# Patient Record
Sex: Female | Born: 1960 | Race: White | Hispanic: Yes | Marital: Single | Smoking: Current every day smoker
Health system: Southern US, Community
[De-identification: ages and names within clinical notes are randomized; demographics above are authoritative.]

## PROBLEM LIST (undated history)

## (undated) DIAGNOSIS — I10 Essential (primary) hypertension: Secondary | ICD-10-CM

## (undated) DIAGNOSIS — T300 Burn of unspecified body region, unspecified degree: Secondary | ICD-10-CM

## (undated) HISTORY — PX: SKIN GRAFT: SHX250

---

## 2007-03-21 ENCOUNTER — Emergency Department (HOSPITAL_COMMUNITY): Admission: EM | Admit: 2007-03-21 | Discharge: 2007-03-21 | Payer: Self-pay | Admitting: Emergency Medicine

## 2009-02-05 IMAGING — CR DG CHEST 2V
2 series · 2 of 2 positions shown · non-contrast
Comparison: None.

CLINICAL DATA: Soft tissue mass posterior left chest/cough.
 CHEST - 2 VIEW:

[w chest pa]
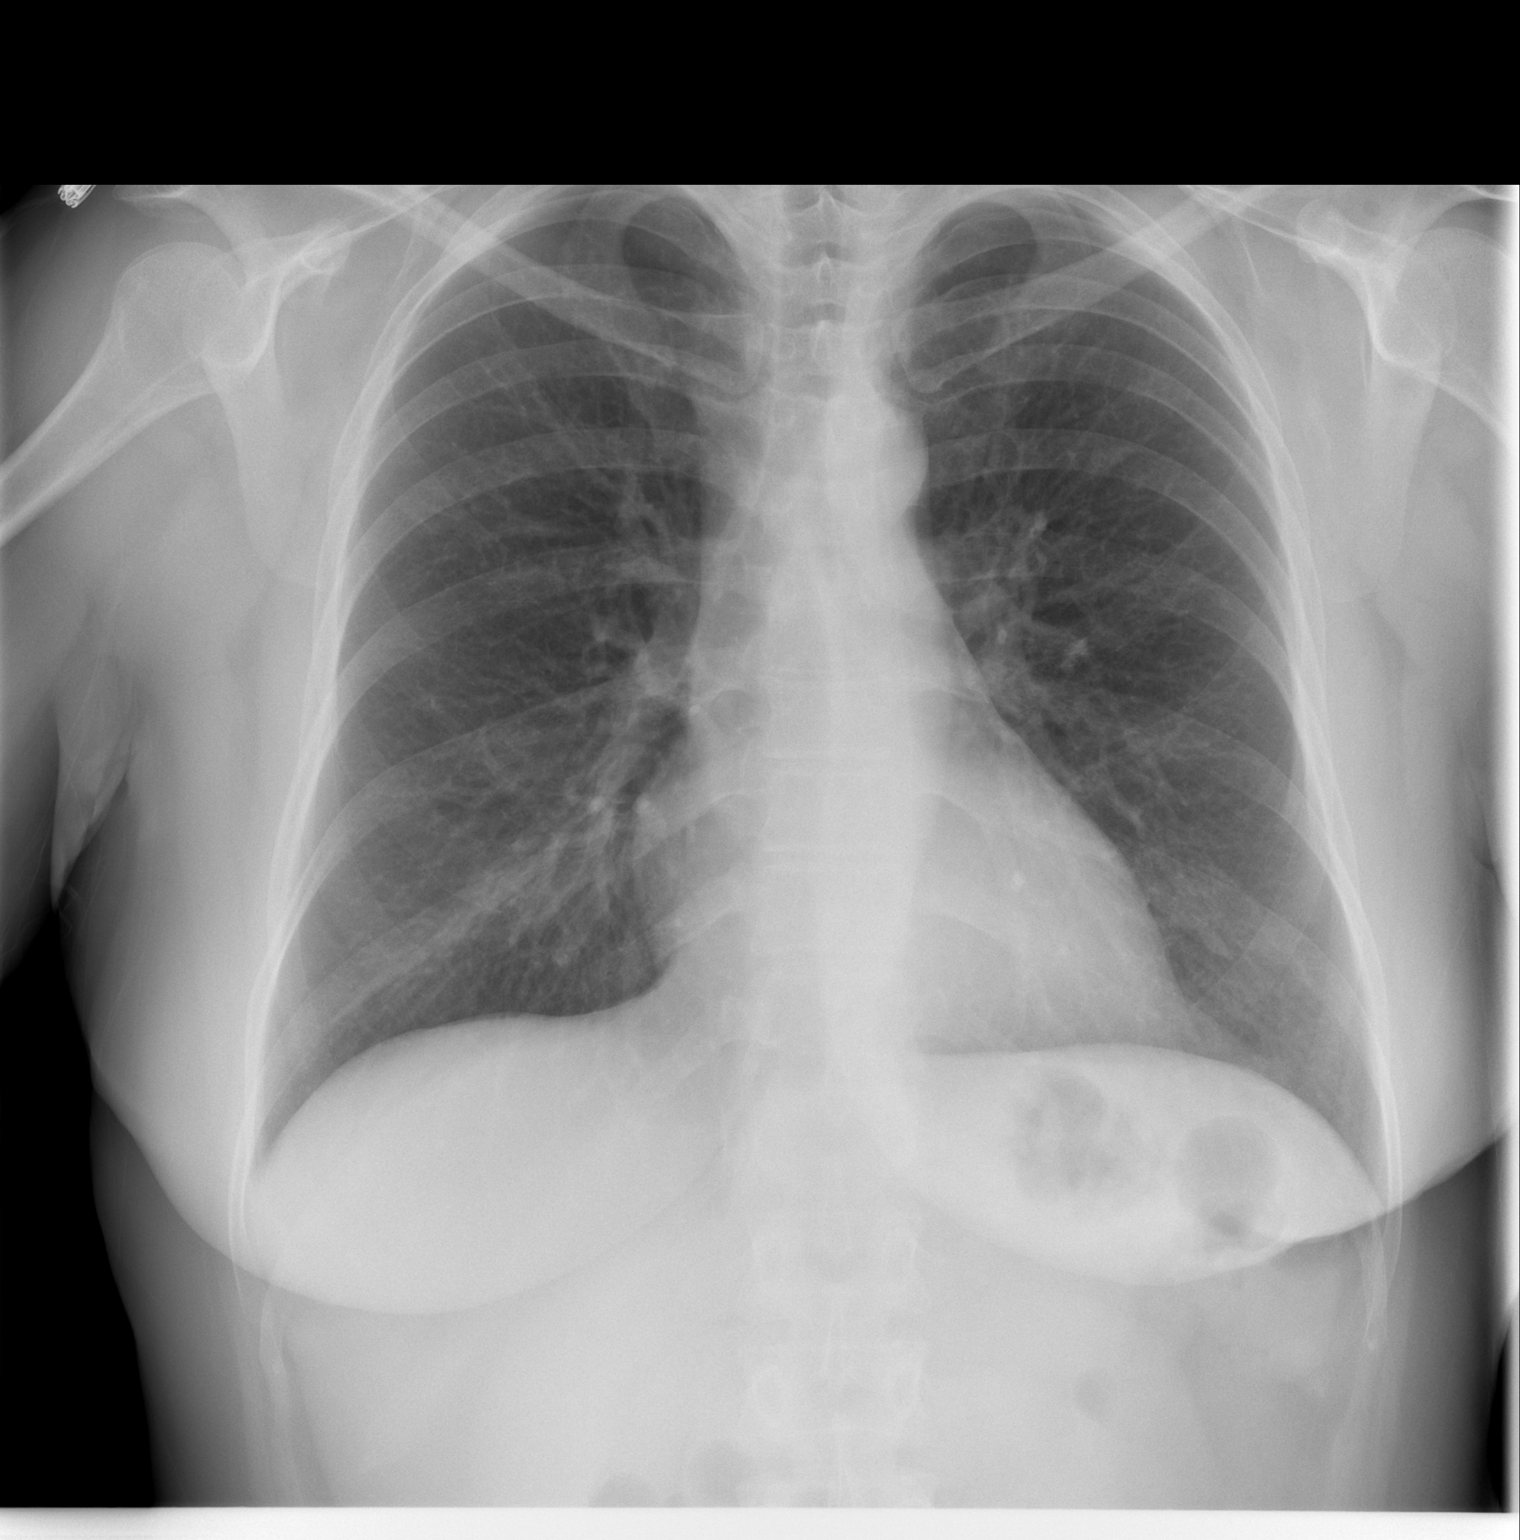

[w chest lat]
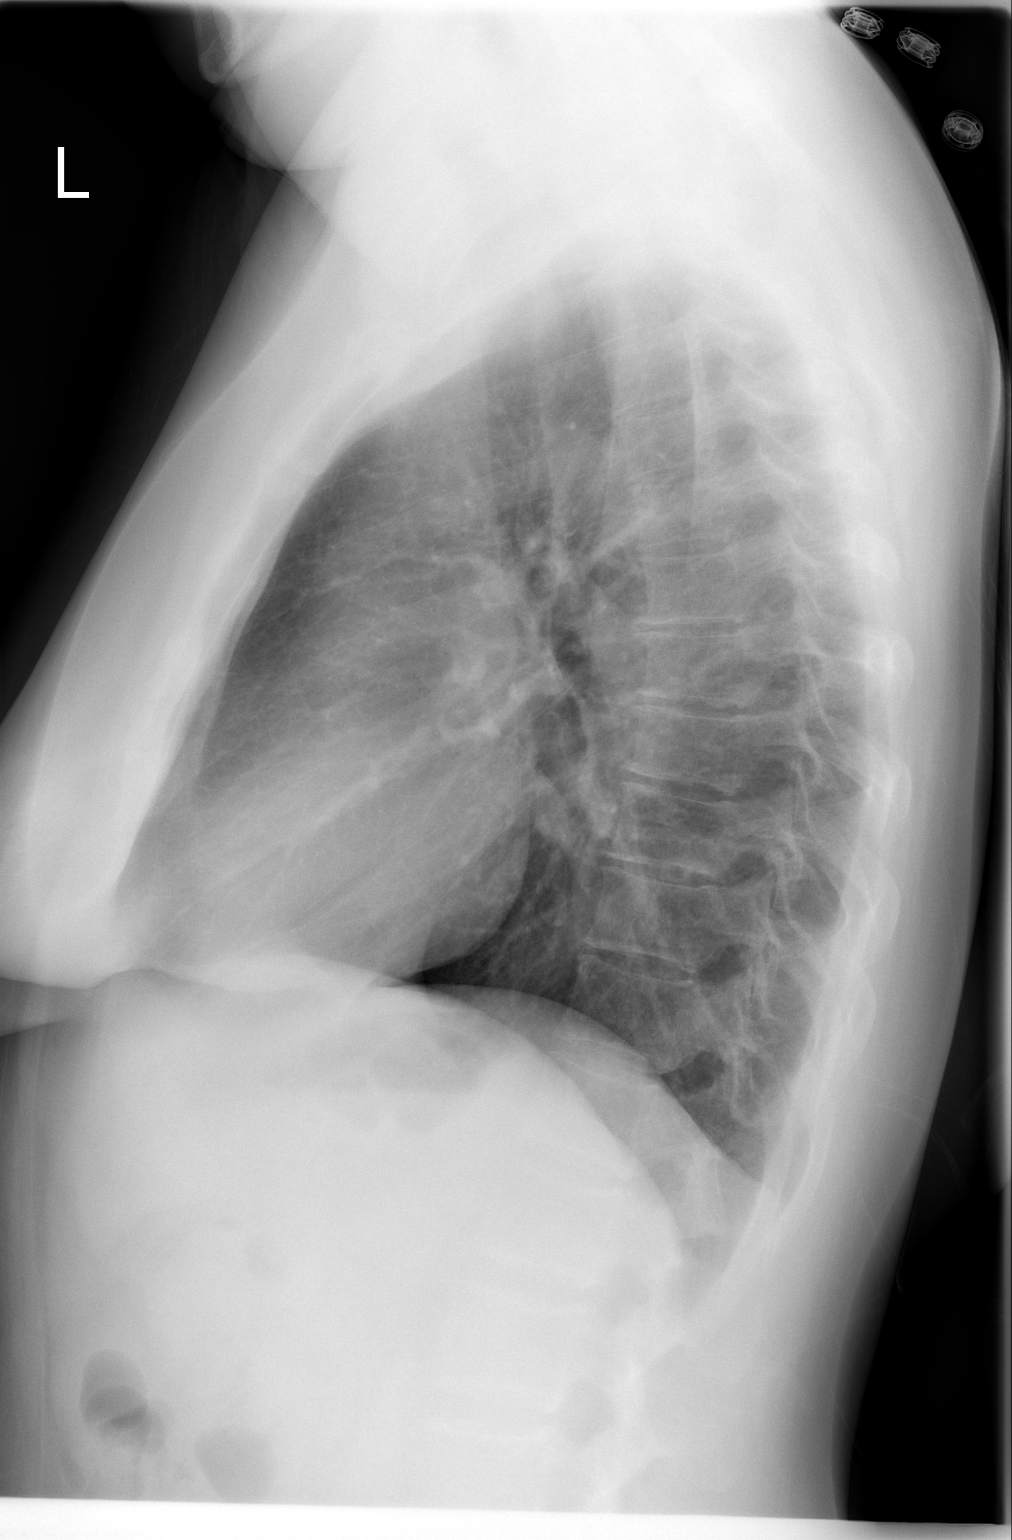

[2 of 2 positions shown; findings below may reference images not displayed]

FINDINGS: The heart and lungs are normal. There is mild peribronchial thickening. No soft tissue or bony mass is evident.
IMPRESSION: 1.  No active cardiopulmonary disease. 
 2.  No soft tissue or bony chest wall masses.

## 2014-05-12 ENCOUNTER — Emergency Department (HOSPITAL_COMMUNITY): Payer: Medicaid - Out of State

## 2014-05-12 ENCOUNTER — Encounter (HOSPITAL_COMMUNITY): Payer: Self-pay | Admitting: Emergency Medicine

## 2014-05-12 DIAGNOSIS — R079 Chest pain, unspecified: Secondary | ICD-10-CM | POA: Diagnosis not present

## 2014-05-12 DIAGNOSIS — I10 Essential (primary) hypertension: Secondary | ICD-10-CM | POA: Diagnosis not present

## 2014-05-12 DIAGNOSIS — Z720 Tobacco use: Secondary | ICD-10-CM | POA: Diagnosis not present

## 2014-05-12 LAB — CBC
HEMATOCRIT: 43.7 % (ref 36.0–46.0)
HEMOGLOBIN: 15.1 g/dL — AB (ref 12.0–15.0)
MCH: 29.5 pg (ref 26.0–34.0)
MCHC: 34.6 g/dL (ref 30.0–36.0)
MCV: 85.5 fL (ref 78.0–100.0)
Platelets: 306 10*3/uL (ref 150–400)
RBC: 5.11 MIL/uL (ref 3.87–5.11)
RDW: 13.3 % (ref 11.5–15.5)
WBC: 11.3 10*3/uL — ABNORMAL HIGH (ref 4.0–10.5)

## 2014-05-12 LAB — BASIC METABOLIC PANEL
Anion gap: 16 — ABNORMAL HIGH (ref 5–15)
BUN: 13 mg/dL (ref 6–23)
CALCIUM: 9.6 mg/dL (ref 8.4–10.5)
CO2: 21 mEq/L (ref 19–32)
Chloride: 105 mEq/L (ref 96–112)
Creatinine, Ser: 0.59 mg/dL (ref 0.50–1.10)
GFR calc Af Amer: >90 mL/min (ref >90)
GLUCOSE: 122 mg/dL — AB (ref 70–99)
Potassium: 3.6 mEq/L — ABNORMAL LOW (ref 3.7–5.3)
Sodium: 142 mEq/L (ref 137–147)

## 2014-05-12 LAB — I-STAT TROPONIN, ED: Troponin i, poc: 0.03 ng/mL (ref 0.00–0.08)

## 2014-05-12 LAB — PRO B NATRIURETIC PEPTIDE: Pro B Natriuretic peptide (BNP): 22.7 pg/mL (ref 0–125)

## 2014-05-12 NOTE — ED Notes (Signed)
Pt.; reports left lateral ribcage pain worse with deep inspiration onset today , denies injury or fall , mild SOB with occasional productive cough . No  nausea or diaphoresis .

## 2014-05-13 ENCOUNTER — Emergency Department (HOSPITAL_COMMUNITY)
Admission: EM | Admit: 2014-05-13 | Discharge: 2014-05-13 | Discharge status: Against Medical Advice | Payer: Medicaid - Out of State | Attending: Emergency Medicine | Admitting: Emergency Medicine

## 2014-05-13 HISTORY — DX: Essential (primary) hypertension: I10

## 2014-05-13 HISTORY — DX: Burn of unspecified body region, unspecified degree: T30.0

## 2014-05-13 NOTE — ED Notes (Signed)
Pt left AMA.  Did not want to stay.
